# Patient Record
Sex: Male | Born: 1980 | Race: White | Hispanic: No | Marital: Married | Smoking: Current every day smoker
Health system: Southern US, Community
[De-identification: ages and names within clinical notes are randomized; demographics above are authoritative.]

## PROBLEM LIST (undated history)

## (undated) DIAGNOSIS — Z87442 Personal history of urinary calculi: Secondary | ICD-10-CM

## (undated) DIAGNOSIS — R569 Unspecified convulsions: Secondary | ICD-10-CM

## (undated) DIAGNOSIS — K649 Unspecified hemorrhoids: Secondary | ICD-10-CM

## (undated) DIAGNOSIS — I10 Essential (primary) hypertension: Secondary | ICD-10-CM

## (undated) HISTORY — PX: TONSILLECTOMY: SUR1361

---

## 2011-01-09 ENCOUNTER — Other Ambulatory Visit: Payer: Self-pay | Admitting: Family Medicine

## 2011-03-18 ENCOUNTER — Ambulatory Visit: Payer: Self-pay | Admitting: Gastroenterology

## 2013-01-27 IMAGING — US ABDOMEN ULTRASOUND LIMITED
1 series · 17 of 25 positions shown · non-contrast
Comparison: none

REASON FOR EXAM: elevated liver funtion
COMMENTS:

[Series 1: abdomen ultrasound limited · 17 of 88 slices shown]
[im 1/88]
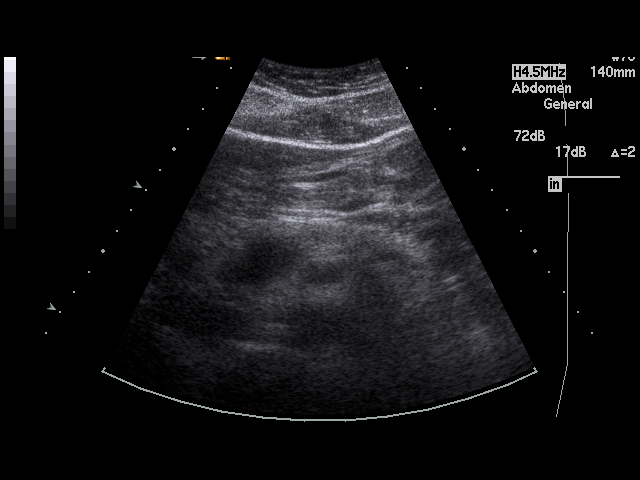
[im 8/88]
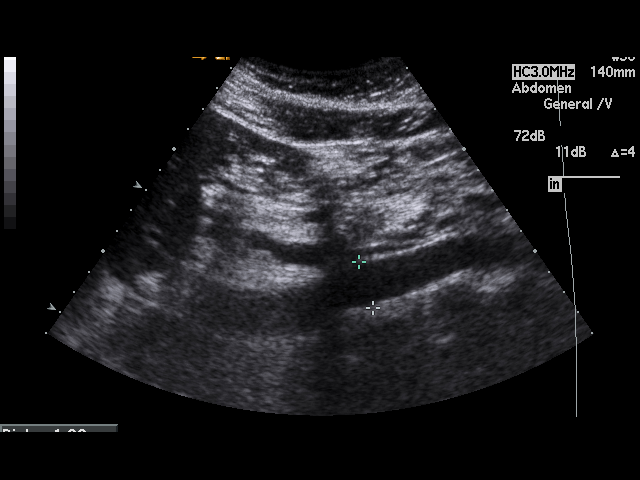
[im 11/88]
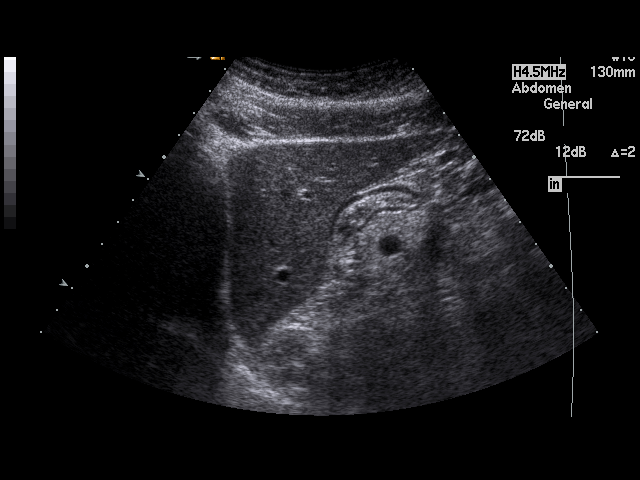
[im 19/88]
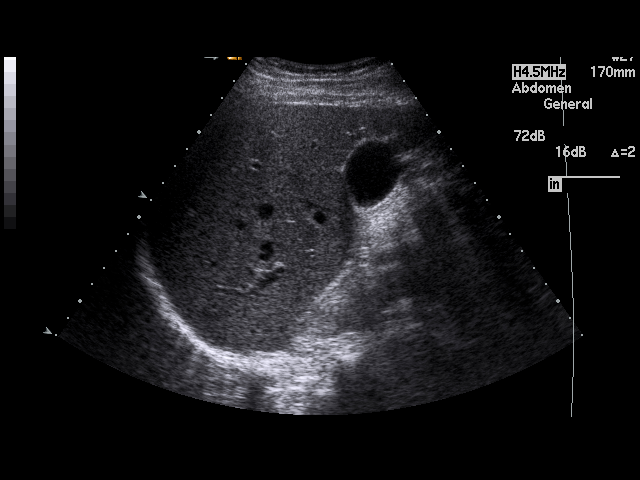
[im 22/88]
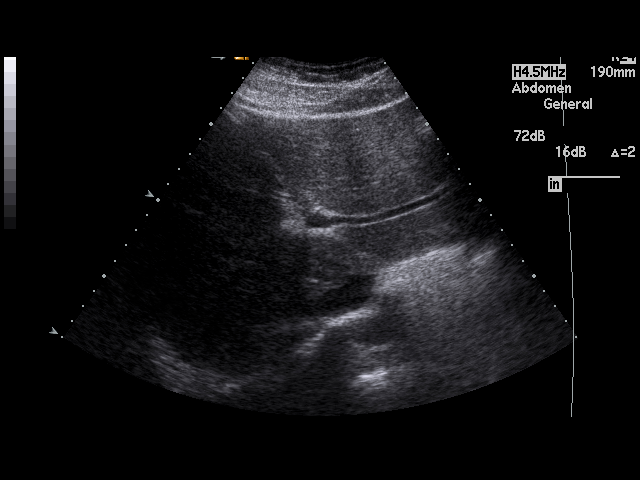
[im 30/88]
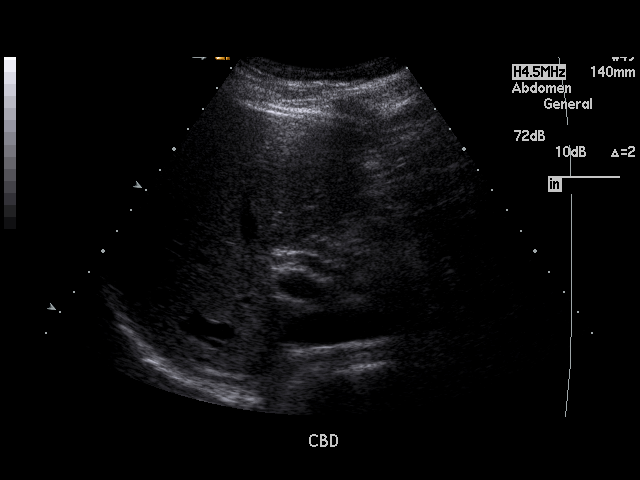
[im 33/88]
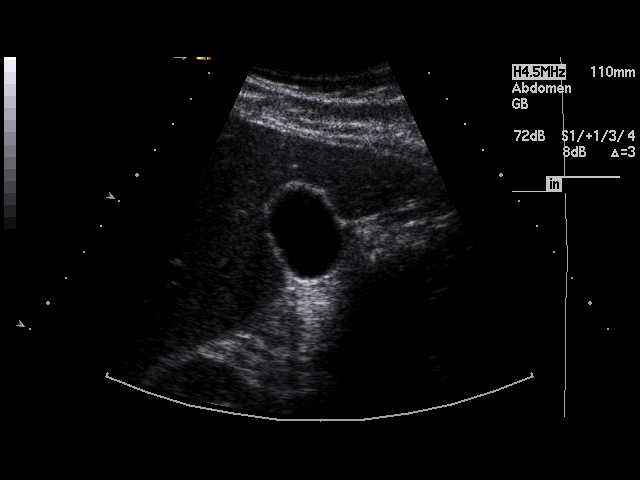
[im 40/88]
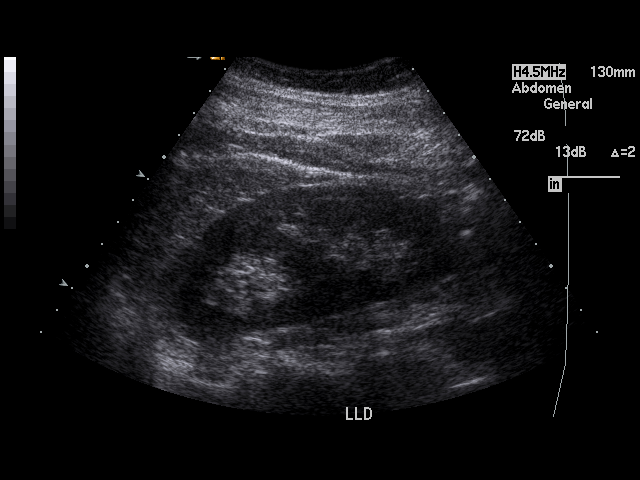
[im 44/88]
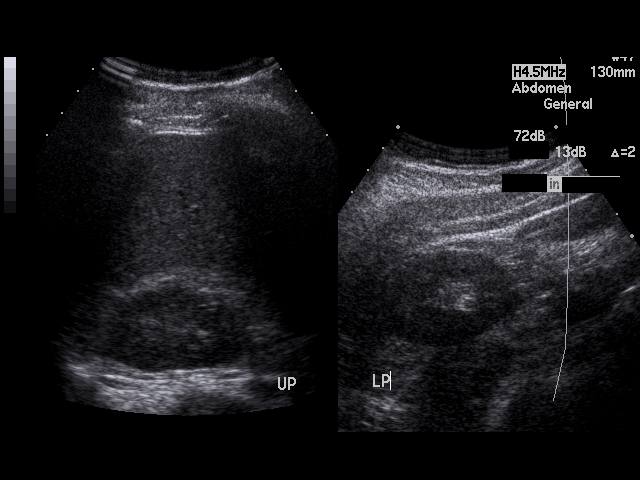
[im 48/88]
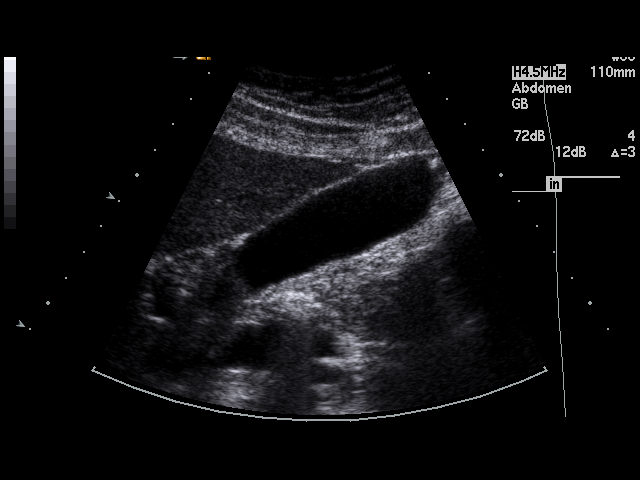
[im 55/88]
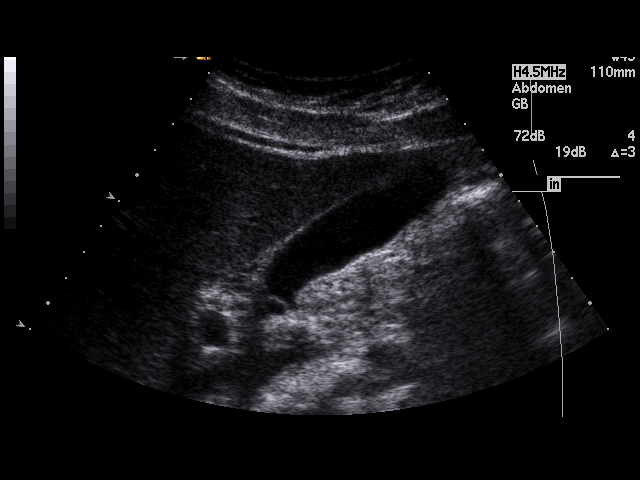
[im 59/88]
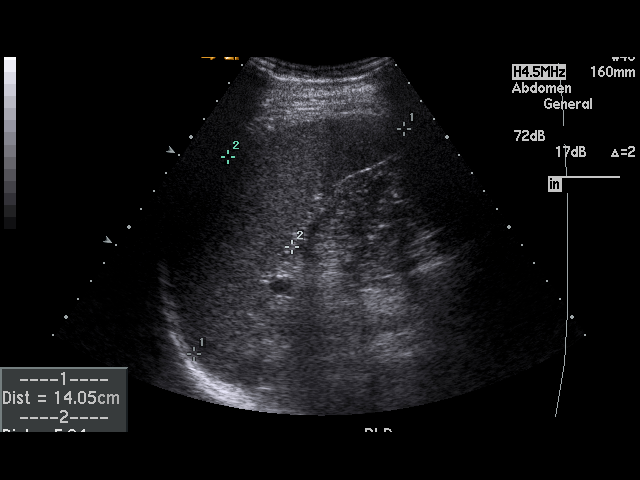
[im 66/88]
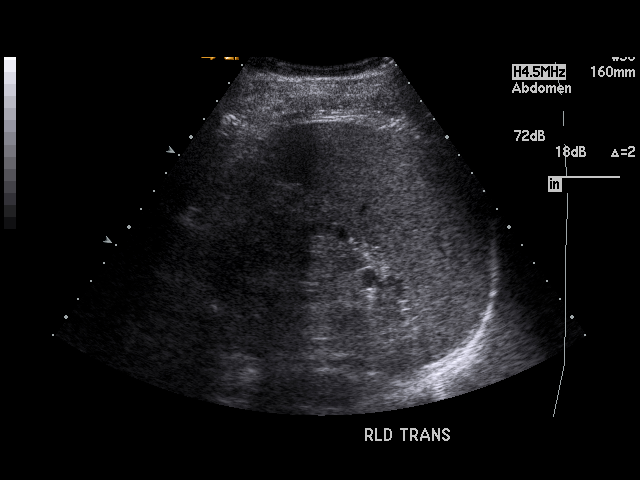
[im 69/88]
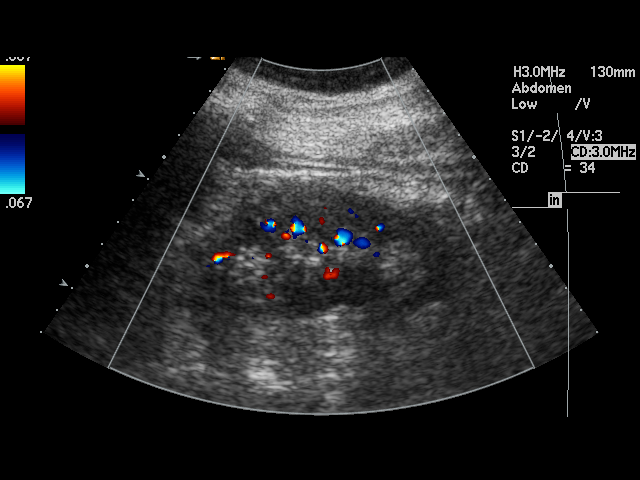
[im 77/88]
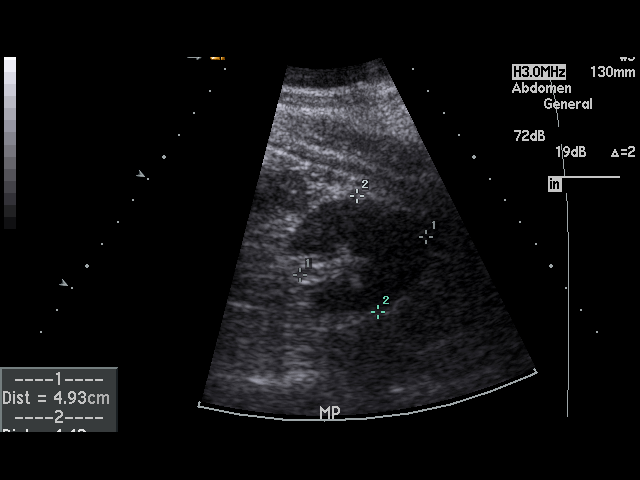
[im 80/88]
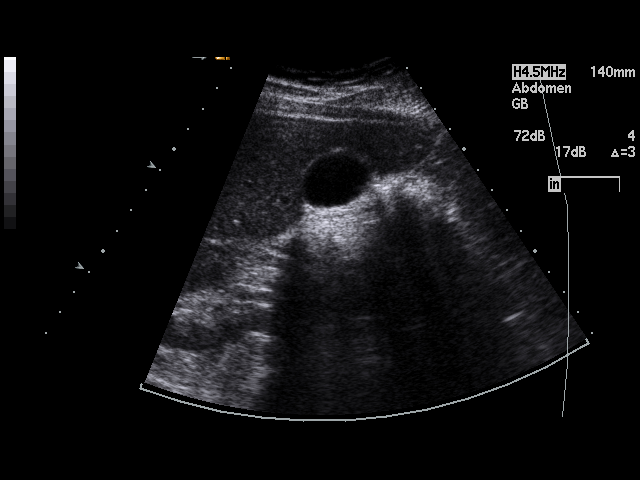
[im 88/88]
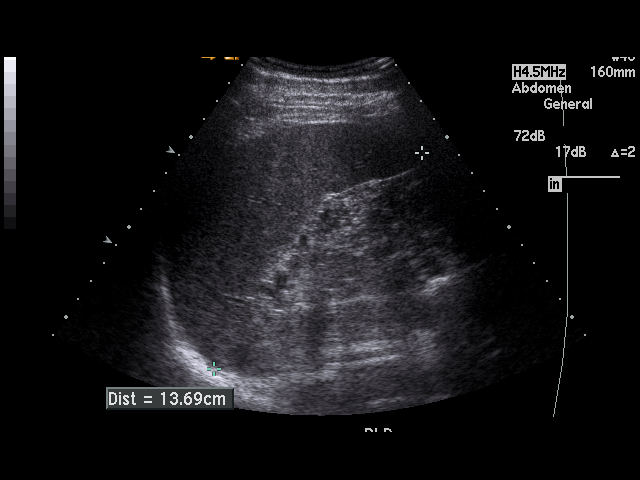

[17 of 25 positions shown; findings below may reference images not displayed]

PROCEDURE:     JUMPER - JUMPER ABDOMEN UPPER GENERAL  - March 18, 2011  [DATE]

RESULT:     Ultrasound of the abdomen shows limited visualization of the
pancreatic tail. The visualized pancreatic body and head show no discrete
mass. The inferior vena cava is patent. The abdominal aorta is normal in
caliber tapering to 1.5 cm distally. The hepatic echotexture appears normal
without ductal dilation or discrete mass. No gallstones are evident. The
gallbladder wall thickness is 1.9 mm. The common bile duct diameter is
normal at 4.8 mm. The portal venous flow is normal. No pericholecystic fluid
or sonographic Murphy's sign is appreciated. The spleen is mildly enlarged
measuring up to 14.05 cm. No focal splenic mass or abnormal echotexture is
demonstrated. The kidneys show normal echotexture without mass or stone
demonstrated. The right kidney measures 10.53 x 5.85 x 4.79 cm. The left
kidney measures 4.42 x 4.93 x 10.52 cm.
IMPRESSION: 1.     The spleen is large but this could be at the upper limits of normal
for a large patient habitus.
2.     Limited visualization of the pancreas. Otherwise unremarkable exam.

## 2016-10-22 HISTORY — PX: COLONOSCOPY: SHX174

## 2017-06-23 ENCOUNTER — Ambulatory Visit: Payer: Self-pay | Admitting: General Surgery

## 2017-06-23 NOTE — H&P (Signed)
PATIENT PROFILE: Jeffery Hanson is a 37 y.o. male who presents to the Clinic for consultation at the request of Dr. Lorenza Hanson for evaluation of hemorrhoids.  PCP:  Jeffery Addison, MD  HISTORY OF PRESENT ILLNESS: Mr. Jeffery Hanson reports having pain on the anal area since 5 days ago. Patient refers he has previous history of hemorrhoids inflammation in the past that had improved with conservative management. The patient refers that since Saturday, he has been feeling significant pain and bleeding. Patient had tried hydrocortisone cream, prednisone orally and other over the counter treatments but non has improved the hemorrhoid this time. Pain does no radiates. Stool and sitting down makes the pain worse. Nothing has improved the pain other than changing position. Denies significant constipation or diarrhea.    PROBLEM LIST: Problem List  Date Reviewed: 06/21/2017   None      GENERAL REVIEW OF SYSTEMS:   General ROS: negative for - chills, fatigue, fever, weight gain or weight loss Allergy and Immunology ROS: negative for - hives  Hematological and Lymphatic ROS: negative for - bleeding problems or bruising, negative for palpable nodes Endocrine ROS: negative for - heat or cold intolerance, hair changes Respiratory ROS: negative for - cough, shortness of breath or wheezing Cardiovascular ROS: no chest pain or palpitations GI ROS: negative for nausea, vomiting, abdominal pain, diarrhea, constipation Musculoskeletal ROS: negative for - joint swelling or muscle pain Neurological ROS: negative for - confusion, syncope Dermatological ROS: negative for pruritus and rash Psychiatric: negative for anxiety, depression, difficulty sleeping and memory loss  MEDICATIONS: Current Outpatient Medications  Medication Sig Dispense Refill  . HYDROcodone-acetaminophen (NORCO) 5-325 mg tablet 1 PO Q 8 HRS PRN PAIN 15 tablet 0  . hydrocortisone (ANUSOL-HC) 2.5 % rectal cream Apply topically 3 (three) times daily 30 g 0   . lidocaine (XYLOCAINE) 2 % jelly APPLY SMALL AMT QID PRN 30 mL 0  . predniSONE (DELTASONE) 10 mg tablet pack 6 day taper. Take as directed with food 21 tablet 0   No current facility-administered medications for this visit.     ALLERGIES: Patient has no known allergies.  PAST MEDICAL HISTORY: History reviewed. No pertinent past medical history.  PAST SURGICAL HISTORY: Past Surgical History:  Procedure Laterality Date  . COLONOSCOPY  10/2016     FAMILY HISTORY: History reviewed. No pertinent family history.   SOCIAL HISTORY: Social History   Socioeconomic History  . Marital status: Married    Spouse name: Not on file  . Number of children: Not on file  . Years of education: Not on file  . Highest education level: Not on file  Social Needs  . Financial resource strain: Not on file  . Food insecurity - worry: Not on file  . Food insecurity - inability: Not on file  . Transportation needs - medical: Not on file  . Transportation needs - non-medical: Not on file  Occupational History  . Not on file  Tobacco Use  . Smoking status: Current Some Day Smoker    Packs/day: 1.00  . Smokeless tobacco: Never Used  Substance and Sexual Activity  . Alcohol use: Yes    Comment: a couple drinks of liquor a week  . Drug use: No  . Sexual activity: Not on file  Other Topics Concern  . Not on file  Social History Narrative  . Not on file    PHYSICAL EXAM: Vitals:   06/23/17 1328  BP: (!) 149/100  Pulse: 83  Temp: 36.3 C (97.4 F)  Body mass index is 29.41 kg/m. Weight: 93 kg (205 lb)   GENERAL: Alert, active, oriented x3  HEENT: Pupils equal reactive to light. Extraocular movements are intact. Sclera clear. Palpebral conjunctiva normal red color.Pharynx clear.  NECK: Supple with no palpable mass and no adenopathy.  LUNGS: Sound clear with no rales rhonchi or wheezes.  HEART: Regular rhythm S1 and S2 without murmur.  ABDOMEN: Soft and depressible, nontender  with no palpable mass, no hepatomegaly.   RECTAL: Internal and external hemorrhoids inflamed. Tender to palpation. Unable to perform complete digital exam due to pain. No bleeding during exam. Adequate rectal tone.   EXTREMITIES: Well-developed well-nourished symmetrical with no dependent edema.  NEUROLOGICAL: Awake alert oriented, facial expression symmetrical, moving all extremities.  REVIEW OF DATA: I have reviewed the following data today: No visits with results within 3 Month(s) from this visit.  Latest known visit with results is:  No results found for any previous visit.     ASSESSMENT: Mr. Jeffery Hanson is a 37 y.o. male presenting for consultation for hemorrhoids.  Patient with significant inflammation of internal and external hemerrhoids. Due to this symptoms patient will start with conservative management but will most likely need surgical intervention since they has not improved with current conservative management ordered by primary care (fiber wafer, hydrocortisone cream, prednisone). Will schedule for hemorrhoidectomy. Patient oriented about the procedure, its benefits and its risk including significant pain, wound complications, infection, bleeding, urinary retention and stool incontinence.   PLAN: 1. Increase fiber supplementation on diet 2. Adequate fluid intake (6-8 glasses of water a day) 3. Avoid spicy and fatty food 4. Warm baths (Sitz bath) 2 or 3 times a day may help with symptoms 5. Avoid sitting on toilet for a prolonged time 6. Go to the bathroom only when have the urge to have the bowel movement 7. Topical steroids may be used for the relieve of pruritus and inflammation 8. Avoid constipation or diarrhea (may use stool softener) 9. Hemorrhoidectomy (45990, [id number]) 10. CBC, CMP 11. Follow staff instruction regarding pre admission process.   Patient and his wife verbalized understanding, all questions were answered, and were agreeable with the plan outlined above.    Carolan ShiverEdgardo Cintron-Diaz, MD  Electronically signed by Carolan ShiverEdgardo Cintron-Diaz, MD

## 2017-06-24 DIAGNOSIS — K649 Unspecified hemorrhoids: Secondary | ICD-10-CM

## 2017-06-24 HISTORY — DX: Unspecified hemorrhoids: K64.9

## 2017-06-29 ENCOUNTER — Encounter
Admission: RE | Admit: 2017-06-29 | Discharge: 2017-06-29 | Disposition: A | Discharge status: Home / Self Care | Payer: BC Managed Care – PPO | Source: Ambulatory Visit | Attending: General Surgery | Admitting: General Surgery

## 2017-06-29 ENCOUNTER — Other Ambulatory Visit: Payer: Self-pay

## 2017-06-29 DIAGNOSIS — I10 Essential (primary) hypertension: Secondary | ICD-10-CM | POA: Insufficient documentation

## 2017-06-29 DIAGNOSIS — Z01810 Encounter for preprocedural cardiovascular examination: Secondary | ICD-10-CM | POA: Insufficient documentation

## 2017-06-29 HISTORY — DX: Unspecified convulsions: R56.9

## 2017-06-29 HISTORY — DX: Essential (primary) hypertension: I10

## 2017-06-29 HISTORY — DX: Personal history of urinary calculi: Z87.442

## 2017-06-29 HISTORY — DX: Unspecified hemorrhoids: K64.9

## 2017-06-29 NOTE — Patient Instructions (Signed)
Your procedure is scheduled on: Thursday, July 07, 2017 Report to Same Day Surgery on the 2nd floor in the [facility]. To find out your arrival time, please call [phone] between 1PM - 3PM on: Wednesday, July 06, 2017  REMEMBER: Instructions that are not followed completely may result in serious medical risk, up to and including death; or upon the discretion of your surgeon and anesthesiologist your surgery may need to be rescheduled.  Do not eat food after midnight the night before your procedure.  No gum chewing or hard candies.  You may however, drink CLEAR liquids up to 2 hours before you are scheduled to arrive at the hospital for your procedure.  Do not drink clear liquids within 2 hours of the start of your surgery.  Clear liquids include: - water  - apple juice without pulp - clear gatorade - black coffee or tea (Do NOT add anything to the coffee or tea) Do NOT drink anything that is not on this list.  No Alcohol for 24 hours before or after surgery.  No Smoking including e-cigarettes for 24 hours prior to surgery. No chewable tobacco products for at least 6 hours prior to surgery. No nicotine patches on the day of surgery.  On the morning of surgery brush your teeth with toothpaste and water, you may rinse your mouth with mouthwash if you wish. Do not swallow any  toothpaste of mouthwash.  Notify your doctor if there is any change in your medical condition (cold, fever, infection).  Do not wear jewelry, make-up, hairpins, clips or nail polish.  Do not wear lotions, powders, or perfumes. You may wear deodorant.  Do not shave 48 hours prior to surgery. Men may shave face and neck.  Contacts and dentures may not be worn into surgery.  Do not bring valuables to the hospital. [facility] any belongings or valuables.   TAKE THESE MEDICATIONS THE MORNING OF SURGERY WITH A SIP OF WATER:  none  NOW!  Stop Anti-inflammatories such as  Advil, Aleve, Ibuprofen, Motrin, Naproxen, Naprosyn, Goodie powder, or aspirin products. (May take Tylenol or Acetaminophen if needed.)  NOW!  Stop ANY OVER THE COUNTER supplements until after surgery.   If you are being discharged the day of surgery, you will not be allowed to drive home. You will need someone to drive you home and stay with you that night.   If you are taking public transportation, you will need to have a responsible adult to with you.  Please call the number above if you have any questions about these instructions.

## 2017-07-07 ENCOUNTER — Encounter: Admission: RE | Payer: Self-pay | Source: Ambulatory Visit

## 2017-07-07 ENCOUNTER — Ambulatory Visit: Admission: RE | Admit: 2017-07-07 | Payer: BC Managed Care – PPO | Source: Ambulatory Visit | Admitting: General Surgery

## 2017-07-07 SURGERY — HEMORRHOIDECTOMY
Anesthesia: Choice

## 2019-03-30 ENCOUNTER — Other Ambulatory Visit: Payer: Self-pay

## 2019-03-30 DIAGNOSIS — Z20822 Contact with and (suspected) exposure to covid-19: Secondary | ICD-10-CM

## 2019-03-31 LAB — NOVEL CORONAVIRUS, NAA: SARS-CoV-2, NAA: NOT DETECTED

## 2024-06-14 ENCOUNTER — Other Ambulatory Visit: Payer: Self-pay

## 2024-06-14 ENCOUNTER — Ambulatory Visit
Admission: RE | Admit: 2024-06-14 | Discharge: 2024-06-14 | Disposition: A | Discharge status: Home / Self Care | Payer: Self-pay | Attending: Gastroenterology | Admitting: Gastroenterology

## 2024-06-14 ENCOUNTER — Ambulatory Visit: Admitting: Anesthesiology

## 2024-06-14 ENCOUNTER — Encounter
Admission: RE | Disposition: A | Discharge status: Home / Self Care | Payer: Self-pay | Source: Home / Self Care | Attending: Gastroenterology

## 2024-06-14 ENCOUNTER — Encounter: Payer: Self-pay | Admitting: Gastroenterology

## 2024-06-14 DIAGNOSIS — Z79899 Other long term (current) drug therapy: Secondary | ICD-10-CM | POA: Diagnosis not present

## 2024-06-14 DIAGNOSIS — K641 Second degree hemorrhoids: Secondary | ICD-10-CM | POA: Insufficient documentation

## 2024-06-14 DIAGNOSIS — I10 Essential (primary) hypertension: Secondary | ICD-10-CM | POA: Insufficient documentation

## 2024-06-14 DIAGNOSIS — K635 Polyp of colon: Secondary | ICD-10-CM | POA: Diagnosis not present

## 2024-06-14 DIAGNOSIS — Z1211 Encounter for screening for malignant neoplasm of colon: Secondary | ICD-10-CM | POA: Diagnosis present

## 2024-06-14 DIAGNOSIS — F17200 Nicotine dependence, unspecified, uncomplicated: Secondary | ICD-10-CM | POA: Diagnosis not present

## 2024-06-14 HISTORY — PX: COLONOSCOPY: SHX5424

## 2024-06-14 HISTORY — PX: POLYPECTOMY: SHX149

## 2024-06-14 MED ORDER — SODIUM CHLORIDE 0.9 % IV SOLN: INTRAVENOUS | Status: DC

## 2024-06-14 MED ORDER — DEXMEDETOMIDINE HCL IN NACL 80 MCG/20ML IV SOLN
INTRAVENOUS | Status: DC | PRN
  Administered 2024-06-14: 8 ug via INTRAVENOUS
  Administered 2024-06-14: 12 ug via INTRAVENOUS

## 2024-06-14 MED ORDER — LIDOCAINE HCL (CARDIAC) PF 100 MG/5ML IV SOSY
PREFILLED_SYRINGE | INTRAVENOUS | Status: DC | PRN
  Administered 2024-06-14: 80 mg via INTRAVENOUS

## 2024-06-14 MED ORDER — PROPOFOL 10 MG/ML IV BOLUS
INTRAVENOUS | Status: DC | PRN
  Administered 2024-06-14 (×2): 50 mg via INTRAVENOUS

## 2024-06-14 MED ORDER — PROPOFOL 500 MG/50ML IV EMUL
INTRAVENOUS | Status: DC | PRN
  Administered 2024-06-14: 150 ug/kg/min via INTRAVENOUS

## 2024-06-14 NOTE — H&P (Signed)
 "  Pre-Procedure H&P   Patient ID: Jeffery Hanson is a 44 y.o. male.  Gastroenterology Provider: Elspeth Ozell Jungling, DO  Referring Provider: Franchot, NP PCP: Franchot Houston, PA-C  Date: 06/14/2024  HPI Jeffery Hanson is a 44 y.o. male who presents today for Colonoscopy for personal history of colon polyps  [facility]- [facility]- 15mm SSP resected  No family history crc/polyps   Past Medical History:  Diagnosis Date   Hemorrhoids 06/2017   History of kidney stones    Hypertension    Seizures (HCC)    age 34 or 7; only had one    Past Surgical History:  Procedure Laterality Date   COLONOSCOPY  10/2016   TONSILLECTOMY      Family History No h/o GI disease or malignancy  Review of Systems  Constitutional:  Negative for activity change, appetite change, chills, diaphoresis, fatigue, fever and unexpected weight change.  HENT:  Negative for trouble swallowing and voice change.   Respiratory:  Negative for shortness of breath and wheezing.   Cardiovascular:  Negative for chest pain, palpitations and leg swelling.  Gastrointestinal:  Negative for abdominal distention, abdominal pain, anal bleeding, blood in stool, constipation, diarrhea, nausea and vomiting.  Musculoskeletal:  Negative for arthralgias and myalgias.  Skin:  Negative for color change and pallor.  Neurological:  Negative for dizziness, syncope and weakness.  Psychiatric/Behavioral:  Negative for confusion. The patient is not nervous/anxious.   All other systems reviewed and are negative.    Medications Medications Ordered Prior to Encounter[1]  Pertinent medications related to GI and procedure were reviewed by me with the patient prior to the procedure  Current Medications[2]  sodium chloride          Allergies[3] Allergies were reviewed by me prior to the procedure  Objective   Body mass index is 25.54 kg/m. Vitals:   06/14/24 0920  BP: (!) 138/99  Pulse: 92  Resp: 16  Temp: (!) 96.2 F (35.7 C)   TempSrc: Temporal  SpO2: 100%  Weight: 80.7 kg  Height: 5' 10 (1.778 m)     Physical Exam Vitals and nursing note reviewed.  Constitutional:      General: He is not in acute distress.    Appearance: Normal appearance. He is not ill-appearing, toxic-appearing or diaphoretic.  HENT:     Head: Normocephalic and atraumatic.     Nose: Nose normal.     Mouth/Throat:     Mouth: Mucous membranes are moist.     Pharynx: Oropharynx is clear.  Eyes:     General: No scleral icterus.    Extraocular Movements: Extraocular movements intact.  Cardiovascular:     Rate and Rhythm: Normal rate and regular rhythm.     Heart sounds: Normal heart sounds. No murmur heard.    No friction rub. No gallop.  Pulmonary:     Effort: Pulmonary effort is normal. No respiratory distress.     Breath sounds: Normal breath sounds. No wheezing, rhonchi or rales.  Abdominal:     General: Bowel sounds are normal. There is no distension.     Palpations: Abdomen is soft.     Tenderness: There is no abdominal tenderness. There is no guarding or rebound.  Musculoskeletal:     Cervical back: Neck supple.     Right lower leg: No edema.     Left lower leg: No edema.  Skin:    General: Skin is warm and dry.     Coloration: Skin is not jaundiced  or pale.  Neurological:     General: No focal deficit present.     Mental Status: He is alert and oriented to person, place, and time. Mental status is at baseline.  Psychiatric:        Mood and Affect: Mood normal.        Behavior: Behavior normal.        Thought Content: Thought content normal.        Judgment: Judgment normal.      Assessment:  Mr. Jeffery Hanson is a 44 y.o. male  who presents today for Colonoscopy for personal history of colon polyps.  Plan:  Colonoscopy with possible intervention today  Colonoscopy with possible biopsy, control of bleeding, polypectomy, and interventions as necessary has been discussed with the patient/patient  representative. Informed consent was obtained from the patient/patient representative after explaining the indication, nature, and risks of the procedure including but not limited to death, bleeding, perforation, missed neoplasm/lesions, cardiorespiratory compromise, and reaction to medications. Opportunity for questions was given and appropriate answers were provided. Patient/patient representative has verbalized understanding is amenable to undergoing the procedure.   Elspeth Ozell Jungling, DO  [facility] Gastroenter[facility]  Portions of the record may have been created with voice recognition software. Occasional wrong-word or 'sound-a-like' substitutions may have occurred due to the inherent limitations of voice recognition software.  Read the chart carefully and recognize, using context, where substitutions may have occurred.     [1]  No current facility-administered medications on file prior to encounter.   Current Outpatient Medications on File Prior to Encounter  Medication Sig Dispense Refill   lisinopril (PRINIVIL,ZESTRIL) 10 MG tablet Take 10 mg by mouth daily. SUPPOSE to take daily but hasn't been taking it regularly.     hydrocortisone (ANUSOL-HC) 2.5 % rectal cream Place 1 application rectally daily as needed for hemorrhoids or anal itching.     ibuprofen (ADVIL,MOTRIN) 200 MG tablet Take 400 mg by mouth daily as needed for headache.     lidocaine  (XYLOCAINE ) 2 % jelly Apply 1 application topically as needed (pain).    [2]  Current Facility-Administered Medications:    0.9 %  sodium chloride  infusion, , Intravenous, Continuous, Jungling Elspeth Ozell, DO [3] No Known Allergies  "

## 2024-06-14 NOTE — Op Note (Signed)
 [facility] Gastroenterology Patient Name: Jeffery Hanson Procedure Date: 06/14/2024 9:36 AM MRN: [id number] Account #: [id number] Date of Birth: 09-26-1980 Admit Type: Outpatient Age: 44 Room: [facility] ENDO ROOM 1 Gender: Male Note Status: Finalized Instrument Name: Colon Scope [id number] Procedure:             Colonoscopy Indications:           High risk colon cancer surveillance: Personal history                         of sessile serrated colon polyp (10 mm or greater in                         size) Providers:             Elspeth Ozell Onita ROSALEA, DO Referring MD:          Elspeth Ozell Onita DO, DO (Referring MD), Norleen LOIS Fanning, MD (Referring MD) Medicines:             Monitored Anesthesia Care Complications:         No immediate complications. Estimated blood loss:                         Minimal. Procedure:             Pre-Anesthesia Assessment:                        - Prior to the procedure, a History and Physical was                         performed, and patient medications and allergies were                         reviewed. The patient is competent. The risks and                         benefits of the procedure and the sedation options and                         risks were discussed with the patient. All questions                         were answered and informed consent was obtained.                         Patient identification and proposed procedure were                         verified by the physician, the nurse, the anesthetist                         and the technician in the endoscopy suite. Mental                         Status Examination: alert and oriented. Airway  Examination: normal oropharyngeal airway and neck                         mobility. Respiratory Examination: clear to                         auscultation. CV Examination: RRR, no murmurs, no S3                         or S4.  Prophylactic Antibiotics: The patient does not                         require prophylactic antibiotics. Prior                         Anticoagulants: The patient has taken no anticoagulant                         or antiplatelet agents. ASA Grade Assessment: II - A                         patient with mild systemic disease. After reviewing                         the risks and benefits, the patient was deemed in                         satisfactory condition to undergo the procedure. The                         anesthesia plan was to use monitored anesthesia care                         (MAC). Immediately prior to administration of                         medications, the patient was re-assessed for adequacy                         to receive sedatives. The heart rate, respiratory                         rate, oxygen saturations, blood pressure, adequacy of                         pulmonary ventilation, and response to care were                         monitored throughout the procedure. The physical                         status of the patient was re-assessed after the                         procedure.                        After obtaining informed consent, the colonoscope was  passed under direct vision. Throughout the procedure,                         the patient's blood pressure, pulse, and oxygen                         saturations were monitored continuously. The                         Colonoscope was introduced through the anus and                         advanced to the the terminal ileum, with                         identification of the appendiceal orifice and IC                         valve. The colonoscopy was performed without                         difficulty. The patient tolerated the procedure well.                         The quality of the bowel preparation was evaluated                         using the BBPS [facility] Bowel Preparation Scale)  with                         scores of: Right Colon = 3 (entire mucosa seen well                         with no residual staining, small fragments of stool or                         opaque liquid), Transverse Colon = 2 (minor amount of                         residual staining, small fragments of stool and/or                         opaque liquid, but mucosa seen well) and Left Colon =                         2 (minor amount of residual staining, small fragments                         of stool and/or opaque liquid, but mucosa seen well).                         The total BBPS score equals 7. The quality of the                         bowel preparation was good. The terminal ileum,  ileocecal valve, appendiceal orifice, and rectum were                         photographed. Findings:      The perianal and digital rectal examinations were normal. Pertinent       negatives include normal sphincter tone.      The terminal ileum appeared normal. Estimated blood loss: none.      Retroflexion in the right colon was performed.      Four sessile polyps were found in the recto-sigmoid colon (2) and       transverse colon (2). The polyps were 2 to 4 mm in size. These polyps       were removed with a cold snare. Resection and retrieval were complete.       Estimated blood loss was minimal.      Non-bleeding internal hemorrhoids were found during retroflexion. The       hemorrhoids were Grade II (internal hemorrhoids that prolapse but reduce       spontaneously). Estimated blood loss: none.      The exam was otherwise without abnormality on direct and retroflexion       views. Impression:            - The examined portion of the ileum was normal.                        - Four 2 to 4 mm polyps at the recto-sigmoid colon and                         in the transverse colon, removed with a cold snare.                         Resected and retrieved.                        -  Non-bleeding internal hemorrhoids.                        - The examination was otherwise normal on direct and                         retroflexion views. Recommendation:        - Patient has a contact number available for                         emergencies. The signs and symptoms of potential                         delayed complications were discussed with the patient.                         Return to normal activities tomorrow. Written                         discharge instructions were provided to the patient.                        - Discharge patient to home.                        -  Resume previous diet.                        - Continue present medications.                        - Await pathology results.                        - Repeat colonoscopy for surveillance based on                         pathology results.                        - Return to referring physician as previously                         scheduled.                        - The findings and recommendations were discussed with                         the patient.                        - The findings and recommendations were discussed with                         the patient's family. Procedure Code(s):     --- Professional ---                        [id number], Colonoscopy, flexible; with removal of                         tumor(s), polyp(s), or other lesion(s) by snare                         technique Diagnosis Code(s):     --- Professional ---                        Z86.010, Personal history of colonic polyps                        K64.1, Second degree hemorrhoids                        D12.7, Benign neoplasm of rectosigmoid junction                        D12.3, Benign neoplasm of transverse colon (hepatic                         flexure or splenic flexure) CPT copyright 2022 American Medical Association. All rights reserved. The codes documented in this report are preliminary and upon coder review may  be  revised to meet current compliance requirements. Attending Participation:      I personally performed the entire procedure. Elspeth Jungling, DO Elspeth Ozell Jungling DO, DO 06/14/2024 10:09:10 AM This report has been signed electronically. Number of Addenda: 0 Note Initiated On: 06/14/2024 9:36 AM  Scope Withdrawal Time: 0 hours 14 minutes 28 seconds  Total Procedure Duration: 0 hours 17 minutes 39 seconds  Estimated Blood Loss:  Estimated blood loss was minimal.      [facility]

## 2024-06-14 NOTE — Anesthesia Postprocedure Evaluation (Signed)
"   Anesthesia Post Note  Patient: Jeffery Hanson  Procedure(s) Performed: COLONOSCOPY POLYPECTOMY, INTESTINE  Patient location during evaluation: Endoscopy Anesthesia Type: General Level of consciousness: awake and alert Pain management: pain level controlled Vital Signs Assessment: post-procedure vital signs reviewed and stable Respiratory status: spontaneous breathing, nonlabored ventilation, respiratory function stable and patient connected to nasal cannula oxygen Cardiovascular status: blood pressure returned to baseline and stable Postop Assessment: no apparent nausea or vomiting Anesthetic complications: no   No notable events documented.   Last Vitals:  Vitals:   06/14/24 1005 06/14/24 1015  BP: (!) 89/68 95/64  Pulse: 87 80  Resp: 14 18  Temp: (!) 35.6 C   SpO2: 96% 100%    Last Pain:  Vitals:   06/14/24 1015  TempSrc:   PainSc: 0-No pain                 [name]      "

## 2024-06-14 NOTE — Interval H&P Note (Signed)
 History and Physical Interval Note: Preprocedure H&P from 06/14/24  was reviewed and there was no interval change after seeing and examining the patient.  Written consent was obtained from the patient after discussion of risks, benefits, and alternatives. Patient has consented to proceed with Colonoscopy with possible intervention   06/14/2024 9:26 AM  Jeffery Hanson  has presented today for surgery, with the diagnosis of Screening for colon cancer (Z12.11).  The various methods of treatment have been discussed with the patient and family. After consideration of risks, benefits and other options for treatment, the patient has consented to  Procedures: COLONOSCOPY (N/A) as a surgical intervention.  The patient's history has been reviewed, patient examined, no change in status, stable for surgery.  I have reviewed the patient's chart and labs.  Questions were answered to the patient's satisfaction.     Elspeth Ozell Jungling

## 2024-06-14 NOTE — Transfer of Care (Signed)
 Immediate Anesthesia Transfer of Care Note  Patient: Jeffery Hanson  Procedure(s) Performed: COLONOSCOPY POLYPECTOMY, INTESTINE  Patient Location: PACU  Anesthesia Type:General  Level of Consciousness: sedated  Airway & Oxygen Therapy: Patient Spontanous Breathing  Post-op Assessment: Report given to RN and Post -op Vital signs reviewed and stable  Post vital signs: Reviewed and stable  Last Vitals:  Vitals Value Taken Time  BP 89/68 06/14/24 10:05  Temp 35.6 C 06/14/24 10:05  Pulse 87 06/14/24 10:05  Resp 14 06/14/24 10:05  SpO2 96 % 06/14/24 10:05    Last Pain:  Vitals:   06/14/24 1005  TempSrc: Tympanic  PainSc: Asleep         Complications: No notable events documented.

## 2024-06-14 NOTE — Anesthesia Preprocedure Evaluation (Signed)
"                                    Anesthesia Evaluation  Patient identified by MRN, date of birth, ID band Patient awake    Reviewed: Allergy & Precautions, NPO status , Patient's Chart, lab work & pertinent test results  History of Anesthesia Complications Negative for: history of anesthetic complications  Airway Mallampati: III  TM Distance: >3 FB Neck ROM: full    Dental no notable dental hx.    Pulmonary neg pulmonary ROS, Current Smoker and Patient abstained from smoking.   Pulmonary exam normal        Cardiovascular hypertension, On Medications negative cardio ROS Normal cardiovascular exam     Neuro/Psych negative neurological ROS  negative psych ROS   GI/Hepatic negative GI ROS, Neg liver ROS,,,  Endo/Other  negative endocrine ROS    Renal/GU negative Renal ROS  negative genitourinary   Musculoskeletal   Abdominal   Peds  Hematology negative hematology ROS (+)   Anesthesia Other Findings Past Medical History: 06/2017: Hemorrhoids No date: History of kidney stones No date: Hypertension No date: Seizures [facility])     Comment:  age 83 or 48; only had one  Past Surgical History: 10/2016: COLONOSCOPY No date: TONSILLECTOMY  BMI    Body Mass Index: 25.54 kg/m      Reproductive/Obstetrics negative OB ROS                              Anesthesia Physical Anesthesia Plan  ASA: 2  Anesthesia Plan: General   Post-op Pain Management: Minimal or no pain anticipated   Induction: Intravenous  PONV Risk Score and Plan: 1 and Propofol  infusion and TIVA  Airway Management Planned: Natural Airway and Nasal Cannula  Additional Equipment:   Intra-op Plan:   Post-operative Plan:   Informed Consent: I have reviewed the patients History and Physical, chart, labs and discussed the procedure including the risks, benefits and alternatives for the proposed anesthesia with the patient or authorized representative who  has indicated his/her understanding and acceptance.     Dental Advisory Given  Plan Discussed with: Anesthesiologist, CRNA and Surgeon  Anesthesia Plan Comments: (Patient consented for risks of anesthesia including but not limited to:  - adverse reactions to medications - risk of airway placement if required - damage to eyes, teeth, lips or other oral mucosa - nerve damage due to positioning  - sore throat or hoarseness - Damage to heart, brain, nerves, lungs, other parts of body or loss of life  Patient voiced understanding and assent.)        Anesthesia Quick Evaluation  "

## 2024-06-15 LAB — SURGICAL PATHOLOGY
